# Patient Record
Sex: Female | Born: 2000 | Race: White | Hispanic: No | Marital: Single | Smoking: Never smoker
Health system: Southern US, Community
[De-identification: ages and names within clinical notes are randomized; demographics above are authoritative.]

## PROBLEM LIST (undated history)

## (undated) DIAGNOSIS — J302 Other seasonal allergic rhinitis: Secondary | ICD-10-CM

## (undated) DIAGNOSIS — E669 Obesity, unspecified: Secondary | ICD-10-CM

## (undated) HISTORY — PX: TONSILLECTOMY: SUR1361

## (undated) HISTORY — PX: TYMPANOSTOMY TUBE PLACEMENT: SHX32

## (undated) HISTORY — PX: ADENOIDECTOMY: SUR15

---
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
## 2021-06-14 ENCOUNTER — Ambulatory Visit (INDEPENDENT_AMBULATORY_CARE_PROVIDER_SITE_OTHER): Payer: Commercial Managed Care - PPO

## 2021-06-14 ENCOUNTER — Ambulatory Visit (INDEPENDENT_AMBULATORY_CARE_PROVIDER_SITE_OTHER): Payer: Commercial Managed Care - PPO | Admitting: Sports Medicine

## 2021-06-14 ENCOUNTER — Other Ambulatory Visit: Payer: Self-pay

## 2021-06-14 DIAGNOSIS — S99912A Unspecified injury of left ankle, initial encounter: Secondary | ICD-10-CM | POA: Insufficient documentation

## 2021-06-14 DIAGNOSIS — M25572 Pain in left ankle and joints of left foot: Secondary | ICD-10-CM

## 2021-06-14 MED ORDER — MELOXICAM 15 MG PO TABS: ORAL_TABLET | ORAL | 3 refills | Status: AC

## 2021-06-14 NOTE — Assessment & Plan Note (Signed)
This is a very pleasant 20 year old female, 3 weeks ago she fell down the stairs, inverted her left ankle, she got an abrasion.  Abrasion is healing well, she just has a single scab that she will leave alone, she does have tenderness over the talar dome, ATFL. Ankle is however stable, adding x-rays, she will get a lace up ankle brace over-the-counter, adding exercises, meloxicam. Return to see me in approximately 4 weeks, MRI ankle if not better.

## 2021-06-14 NOTE — Progress Notes (Signed)
    Procedures performed today:    None.  Independent interpretation of notes and tests performed by another provider:   None.  Brief History, Exam, Impression, and Recommendations:    Left ankle injury This is a very pleasant 20 year old female, 3 weeks ago she fell down the stairs, inverted her left ankle, she got an abrasion.  Abrasion is healing well, she just has a single scab that she will leave alone, she does have tenderness over the talar dome, ATFL. Ankle is however stable, adding x-rays, she will get a lace up ankle brace over-the-counter, adding exercises, meloxicam. Return to see me in approximately 4 weeks, MRI ankle if not better.    ___________________________________________ Ihor Austin. Benjamin Stain, M.D., ABFM., CAQSM. Primary Care and Sports Medicine [facility]  Adjunct Instructor of Family Medicine  [employer]

## 2021-07-12 ENCOUNTER — Telehealth: Payer: Self-pay | Admitting: Pediatrics

## 2021-07-12 ENCOUNTER — Ambulatory Visit: Payer: Commercial Managed Care - PPO | Admitting: Sports Medicine

## 2021-07-12 NOTE — Telephone Encounter (Signed)
Amy Webster called. Amy Webster wants to  cancel her appointment today because she  has been around someone who has tested positive for the flu.

## 2021-07-12 NOTE — Telephone Encounter (Signed)
That is okay, can switch to video or telephone as well if she wanted to talk.

## 2021-07-13 NOTE — Telephone Encounter (Signed)
I did not see this note in time.  I will remove her from the schedule so that she does not get a no show.

## 2022-10-17 ENCOUNTER — Ambulatory Visit
Admission: RE | Admit: 2022-10-17 | Discharge: 2022-10-17 | Disposition: A | Discharge status: Home / Self Care | Payer: Commercial Managed Care - PPO | Source: Ambulatory Visit

## 2022-10-17 ENCOUNTER — Ambulatory Visit: Payer: Self-pay

## 2022-10-17 VITALS — BP 129/80 | HR 79 | Temp 98.0°F | Resp 16 | Ht 63.0 in | Wt 285.0 lb

## 2022-10-17 DIAGNOSIS — M79672 Pain in left foot: Secondary | ICD-10-CM | POA: Diagnosis not present

## 2022-10-17 NOTE — ED Triage Notes (Signed)
Patient report left foot/ankle pain x 3 days. No significant instance for injury other than being on her feet working the past 4 days. H/o stress fracture to same area 5 years ago. Taking IBF

## 2022-10-17 NOTE — Discharge Instructions (Signed)
Rest - try to avoid prolonged standing and high impact activity Ice - apply for 20 minutes 3-4 times daily Elevation - prop up on a pillow Ibuprofen - 800 mg 3x daily (every 6 hours)  Follow with foot specialist on Friday

## 2022-10-17 NOTE — ED Provider Notes (Signed)
[facility]    CSN: [id number] Arrival date & time: 10/17/22  1017      History   Chief Complaint Chief Complaint  Patient presents with   Foot Pain    left    HPI Amy Webster is a 22 y.o. female.  Here with 3 day history of left foot pain She has been on her feet for 14 hour shifts this weekend Denies new injury or trauma. History of stress fracture in pinky toe She has tried ibuprofen a few times that helps Does not currently have pain. 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      Home Medications    Prior to Admission medications   Medication Sig Start Date End Date Taking? Authorizing Provider  JUNEL FE 1/20 1-20 MG-MCG tablet Take 1 tablet by mouth daily. 05/25/21   [provider]  meloxicam (MOBIC) 15 MG tablet One tab PO qAM with a meal for 2 weeks, then daily prn pain. 06/14/21   Silverio Decamp, MD  phentermine (ADIPEX-P) 37.5 MG tablet TAKE 1/2 TO 1 TABLET (18.75-37.5 MG DOSE) BY MOUTH ONCE DAILY WITH BREAKFAST. 09/26/19   [provider]    Family History Family History  Problem Relation Age of Onset   Hypertension Father    Hypertension Mother     Social History Social History   Tobacco Use   Smoking status: Never   Smokeless tobacco: Never  Vaping Use   Vaping Use: Never used  Substance Use Topics   Alcohol use: No   Drug use: No      Allergies   Shellfish allergy   Review of Systems Review of Systems As per HPI  Physical Exam Triage Vital Signs ED Triage Vitals  Enc Vitals Group     BP 10/17/22 1026 129/80     Pulse Rate 10/17/22 1026 79     Resp 10/17/22 1026 16     Temp 10/17/22 1026 98 F (36.7 C)     Temp Source 10/17/22 1026 Oral     SpO2 10/17/22 1026 98 %     Weight 10/17/22 1023 285 lb (129.3 kg)     Height 10/17/22 1023 '5\' 3"'$  (1.6 m)     Head Circumference --      Peak Flow --      Pain Score 10/17/22 1023 2     Pain Loc --      Pain Edu? --      Excl. in [place]? --    No data found.  Updated Vital Signs BP 129/80 (BP Location: Right Arm)   Pulse 79   Temp 98 F (36.7 C) (Oral)   Resp 16   Ht '5\' 3"'$  (1.6 m)   Wt 285 lb (129.3 kg)   LMP 09/26/2022   SpO2 98%   BMI 50.49 kg/m  Physical Exam Vitals and nursing note reviewed.  Constitutional:      General: She is not in acute distress.    Appearance: Normal appearance.  Cardiovascular:     Pulses: Normal pulses.  Musculoskeletal:        General: No swelling, tenderness or deformity. Normal range of motion.     Comments: Full ROM at ankle. Non tender over malleolus, midfoot, toes. Strong pulse. Cap refill of toes < 2 seconds  Skin:    General: Skin is warm and dry.     Capillary Refill: Capillary refill takes less than 2 seconds.     Findings: No bruising.  Neurological:     Mental Status: She is alert and oriented to person, place, and time.     UC Treatments / Results  Labs (all labs ordered are listed, but only abnormal results are displayed) Labs Reviewed - No data to display  EKG  Radiology No results found.  Procedures Procedures (including critical care time)  Medications Ordered in UC Medications - No data to display  Initial Impression / Assessment and Plan / UC Course  I have reviewed the triage vital signs and the nursing notes.  Pertinent labs & imaging results that were available during my  care of the patient were reviewed by me and considered in my medical decision making (see chart for details).  No indication for x-ray imaging today.  Recommend RICE therapy with ibuprofen.  She does have a follow-up appointment with her orthopedic specialist in 3 days.  Final Clinical Impressions(s) / UC Diagnoses   Final diagnoses:  Foot pain, left     Discharge Instructions      Rest - try to avoid prolonged standing and high impact activity Ice - apply for 20 minutes 3-4 times daily Elevation - prop up on a pillow Ibuprofen - 800 mg 3x daily (every 6 hours)  Follow with foot specialist on Friday    ED Prescriptions   None    PDMP not reviewed this encounter.   [place], [place] 10/17/22 1145

## 2022-10-20 ENCOUNTER — Ambulatory Visit (INDEPENDENT_AMBULATORY_CARE_PROVIDER_SITE_OTHER): Payer: Commercial Managed Care - PPO

## 2022-10-20 ENCOUNTER — Ambulatory Visit (INDEPENDENT_AMBULATORY_CARE_PROVIDER_SITE_OTHER): Payer: Commercial Managed Care - PPO | Admitting: Sports Medicine

## 2022-10-20 DIAGNOSIS — M84375A Stress fracture, left foot, initial encounter for fracture: Secondary | ICD-10-CM

## 2022-10-20 DIAGNOSIS — M79672 Pain in left foot: Secondary | ICD-10-CM | POA: Diagnosis not present

## 2022-10-20 NOTE — Assessment & Plan Note (Signed)
Obesity with multiple comorbidities including new onset stress injuries. She is currently taking phentermine without much efficacy, explained her that phentermine was fairly good for short-term weight loss, but I would like her to discuss Zepbound and Wegovy with her PCP. There is also a generic compounded form of semaglutide available at a compounding pharmacy in [place] if unable to get the branded drugs approved.

## 2022-10-20 NOTE — Progress Notes (Signed)
    Procedures performed today:    None.  Independent interpretation of notes and tests performed by another provider:   None.  Brief History, Exam, Impression, and Recommendations:    Stress fracture of metatarsal bone of left foot Amy Webster 22 year old female, she does have a history of morbid obesity, she also has a history of an MRI confirmed fifth metatarsal stress injury with marrow edema, she did well back in 2019 with a postop shoe immobilization. For the past week or so she has been spending a lot of time on her feet at work, she is now having increasing pain second through fifth metatarsal shafts with radiation up to the cuboid. I am concerned about a recurrent stress injury, we will add a cam boot that would like her to wear for about 6 weeks, I would like x-rays today, she has meloxicam at home. We also discussed aggressive weight loss, this is certainly a crucial component in preventing further stress injuries. I would like to see her back in about 6 weeks.  Morbid obesity ([place]) Obesity with multiple comorbidities including new onset stress injuries. She is currently taking phentermine without much efficacy, explained her that phentermine was fairly good for short-term weight loss, but I would like her to discuss Zepbound and Wegovy with her PCP. There is also a generic compounded form of semaglutide available at a compounding pharmacy in [place] if unable to get the branded drugs approved.    ____________________________________________ Gwen Her. Dianah Field, M.D., ABFM., CAQSM., AME. Primary Care and Sports Medicine [facility]  Adjunct Professor of [place] [employer] [facility] [employer]  Risk manager

## 2022-10-20 NOTE — Assessment & Plan Note (Signed)
Pleasant 22 year old female, she does have a history of morbid obesity, she also has a history of an MRI confirmed fifth metatarsal stress injury with marrow edema, she did well back in 2019 with a postop shoe immobilization. For the past week or so she has been spending a lot of time on her feet at work, she is now having increasing pain second through fifth metatarsal shafts with radiation up to the cuboid. I am concerned about a recurrent stress injury, we will add a cam boot that would like her to wear for about 6 weeks, I would like x-rays today, she has meloxicam at home. We also discussed aggressive weight loss, this is certainly a crucial component in preventing further stress injuries. I would like to see her back in about 6 weeks.

## 2022-10-20 NOTE — Patient Instructions (Signed)
Look into [place] with your PCP

## 2022-10-23 ENCOUNTER — Telehealth: Payer: Self-pay | Admitting: Sports Medicine

## 2022-10-23 NOTE — Telephone Encounter (Signed)
She will need to come back as a nurse visit for staff to exchange for another one.

## 2022-10-23 NOTE — Telephone Encounter (Signed)
Patient called she says the Velcro is coming off the top of her boot she is asking for another one

## 2022-10-24 ENCOUNTER — Ambulatory Visit: Payer: Commercial Managed Care - PPO | Admitting: Sports Medicine

## 2022-12-01 ENCOUNTER — Ambulatory Visit (INDEPENDENT_AMBULATORY_CARE_PROVIDER_SITE_OTHER): Payer: Commercial Managed Care - PPO | Admitting: Sports Medicine

## 2022-12-01 DIAGNOSIS — M84375G Stress fracture, left foot, subsequent encounter for fracture with delayed healing: Secondary | ICD-10-CM | POA: Diagnosis not present

## 2022-12-01 MED ORDER — SEMAGLUTIDE (2 MG/DOSE) 8 MG/3ML ~~LOC~~ SOPN: PEN_INJECTOR | SUBCUTANEOUS | 3 refills | Status: DC

## 2022-12-01 NOTE — Assessment & Plan Note (Signed)
This is a very pleasant 22 year old female, she has a long history of left foot pain, she does have a history of MRI confirmed fifth metatarsal stress injury with bone marrow edema back in 2019 with postop shoe immobilization she recovered. Unfortunately now for about 6 to 7 weeks she has had increasing pain second through fifth metatarsal shafts. We placed her in a boot and recommended minimize weightbearing. 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 She is a necessary employee, and really wants to put in the work to help out the company. Her work has been helpful, and they have minimized the amount of time she has to stand but unfortunately she has not improved dramatically. At this point due to failure of conservative treatment and greater than 6 weeks of symptoms with unrevealing x-rays we will proceed with an MRI of the left foot. She will continue the boot and go into a rolling knee scooter. We will do this for 1 to 2 weeks and if she still does not notice improvement she will go into a cast with a postop shoe.

## 2022-12-01 NOTE — Progress Notes (Signed)
    Procedures performed today:    None.  Independent interpretation of notes and tests performed by another provider:   None.  Brief History, Exam, Impression, and Recommendations:    Stress fracture of metatarsal bone of left foot This is a very pleasant 22 year old female, she has a long history of left foot pain, she does have a history of MRI confirmed fifth metatarsal stress injury with bone marrow edema back in 2019 with postop shoe immobilization she recovered. Unfortunately now for about 6 to 7 weeks she has had increasing pain second through fifth metatarsal shafts. We placed her in a boot and recommended minimize weightbearing. 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 She is a necessary employee, and really wants to put in the work to help out the company. Her work has been helpful, and they have minimized the amount of time she has to stand but unfortunately she has not improved dramatically. At this point due to failure of conservative treatment and greater than 6 weeks of symptoms with unrevealing x-rays we will proceed with an MRI of the left foot. She will continue the boot and go into a rolling knee scooter. We will do this for 1 to 2 weeks and if she still does not notice improvement she will go into a cast with a postop shoe.  Morbid obesity (HCC) Working on getting Zepbound approved with her PCP. It sounds like they are still struggling, this is unfortunately typical for getting GLP-1s approved for weight loss these days, I am going to give her a prescription for compounded semaglutide, she can get this at a pharmacy in [place] for about $200 a month, they will hold onto this for now, and if Zepbound is approved I would certainly recommend she use the branded Zepbound rather than the compounded semaglutide.    ____________________________________________ Ihor Austin. Benjamin Stain, M.D., ABFM., CAQSM.,  AME. Primary Care and Sports Medicine [facility]  Adjunct Professor of Family Medicine  [employer]  Restaurant manager, fast food

## 2022-12-01 NOTE — Assessment & Plan Note (Signed)
Working on getting Zepbound approved with her PCP. It sounds like they are still struggling, this is unfortunately typical for getting GLP-1s approved for weight loss these days, I am going to give her a prescription for compounded semaglutide, she can get this at a pharmacy in [place] for about $200 a month, they will hold onto this for now, and if Zepbound is approved I would certainly recommend she use the branded Zepbound rather than the compounded semaglutide.

## 2022-12-11 ENCOUNTER — Ambulatory Visit (INDEPENDENT_AMBULATORY_CARE_PROVIDER_SITE_OTHER): Payer: Commercial Managed Care - PPO

## 2022-12-11 DIAGNOSIS — M84375G Stress fracture, left foot, subsequent encounter for fracture with delayed healing: Secondary | ICD-10-CM

## 2022-12-12 ENCOUNTER — Ambulatory Visit
Admission: RE | Admit: 2022-12-12 | Discharge: 2022-12-12 | Disposition: A | Discharge status: Home / Self Care | Payer: Commercial Managed Care - PPO | Source: Ambulatory Visit | Attending: Family Medicine | Admitting: Family Medicine

## 2022-12-12 VITALS — BP 142/94 | HR 102 | Temp 98.1°F | Resp 16

## 2022-12-12 DIAGNOSIS — J029 Acute pharyngitis, unspecified: Secondary | ICD-10-CM

## 2022-12-12 DIAGNOSIS — K122 Cellulitis and abscess of mouth: Secondary | ICD-10-CM

## 2022-12-12 LAB — POCT RAPID STREP A (OFFICE): Rapid Strep A Screen: NEGATIVE

## 2022-12-12 MED ORDER — PREDNISONE 20 MG PO TABS: ORAL_TABLET | ORAL | 0 refills | Status: AC

## 2022-12-12 MED ORDER — AMOXICILLIN-POT CLAVULANATE 875-125 MG PO TABS: 1.0000 | ORAL_TABLET | Freq: Two times a day (BID) | ORAL | 0 refills | Status: AC

## 2022-12-12 NOTE — ED Provider Notes (Signed)
[facility]    CSN: [id number] Arrival date & time: 12/12/22  9147      History   Chief Complaint Chief Complaint  Patient presents with   Sore Throat    drainage  covid test was negative - Entered by patient   Otalgia    HPI Amy Webster is a 22 y.o. female.   HPI 21 year old female presents with sore throat, earache and congestion for 3 days.  PMH significant for morbid obesity, seasonal allergy, and bilateral patellofemoral syndrome.  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      Home Medications    Prior to Admission medications   Medication Sig Start Date End Date Taking? Authorizing Provider  amoxicillin-clavulanate (AUGMENTIN) 875-125 MG tablet Take 1 tablet by mouth 2 (two) times daily for 10 days. 12/12/22 12/22/22 Yes Trevor Iha, FNP  predniSONE (DELTASONE) 20 MG tablet Take 3 tabs PO daily x 5 days. 12/12/22  Yes Trevor Iha, FNP  JUNEL FE 1/20 1-20 MG-MCG tablet Take 1 tablet by mouth daily. 05/25/21   [provider]  meloxicam (MOBIC) 15 MG tablet One tab PO qAM with a meal for 2 weeks, then daily prn pain. 06/14/21   Monica Becton, MD  phentermine (ADIPEX-P) 37.5 MG tablet TAKE 1/2 TO 1 TABLET (18.75-37.5 MG DOSE) BY MOUTH ONCE DAILY WITH BREAKFAST. 09/26/19   [provider]  Semaglutide, 2 MG/DOSE, 8 MG/3ML SOPN Semaglutide 2.5 mg/mL + Vit B6 10mg /mL.  Inject 10u/0.49mL/0.25 mg subcu weekly  x4 weeks then 20u/0.58mL/0.5 mg subcu weekly x4 weeks, then 40u/0.74mL/1 mg subcu weekly x4 weeks then 68u/0.72mL/1.7mg  subcu weekly x4 weeks then 100u/39mL/2.5mg  subcu weekly. 12/01/22   Monica Becton, MD    Family History Family History  Problem Relation Age of Onset   Hypertension Father    Hypertension Mother     Social History Social History   Tobacco Use   Smoking status: Never   Smokeless tobacco: Never  Vaping Use   Vaping Use: Never used  Substance Use Topics   Alcohol use: No   Drug use: No     Allergies   Shellfish allergy   Review of Systems Review of Systems  HENT:  Positive for congestion and sore throat.   All other systems reviewed and are negative.    Physical Exam Triage Vital Signs ED Triage Vitals  Enc Vitals Group     BP 12/12/22 0958 (!) 142/94     Pulse Rate 12/12/22 0956 (!) 102     Resp 12/12/22 0956 16     Temp 12/12/22 0956 98.1 F (36.7 C)     Temp src --      SpO2 12/12/22 0956 97 %     Weight --      Height --      Head Circumference --      Peak Flow --  Pain Score 12/12/22 0955 10     Pain Loc --      Pain Edu? --      Excl. in [place]? --    No data found.  Updated Vital Signs BP (!) 142/94   Pulse (!) 102   Temp 98.1 F (36.7 C)   Resp 16   LMP 12/01/2022 (Exact Date)   SpO2 97%      Physical Exam Vitals and nursing note reviewed.  Constitutional:      Appearance: Normal appearance. She is well-developed. She is obese. She is ill-appearing.  HENT:     Head: Normocephalic and atraumatic.     Right Ear: Tympanic membrane, ear canal and external ear normal.     Left Ear: Tympanic membrane, ear canal and external ear normal.     Mouth/Throat:     Mouth: Mucous membranes are moist.     Pharynx: Oropharynx is clear. Uvula midline. Posterior oropharyngeal erythema and uvula swelling present.     Tonsils: 2+ on the right. 2+ on the left.  Eyes:     Extraocular Movements: Extraocular movements intact.      Conjunctiva/sclera: Conjunctivae normal.     Pupils: Pupils are equal, round, and reactive to light.  Cardiovascular:     Rate and Rhythm: Normal rate and regular rhythm.     Pulses: Normal pulses.     Heart sounds: Normal heart sounds.  Pulmonary:     Effort: Pulmonary effort is normal.     Breath sounds: Normal breath sounds. No wheezing, rhonchi or rales.  Musculoskeletal:        General: Normal range of motion.     Cervical back: Normal range of motion and neck supple.  Skin:    General: Skin is warm and dry.  Neurological:     General: No focal deficit present.     Mental Status: She is alert and oriented to person, place, and time.  Psychiatric:        Mood and Affect: Mood normal.        Behavior: Behavior normal.        Thought Content: Thought content normal.      UC Treatments / Results  Labs (all labs ordered are listed, but only abnormal results are displayed) Labs Reviewed  CULTURE, GROUP A STREP [facility])  POCT RAPID STREP A (OFFICE)    EKG   Radiology No results found.  Procedures Procedures (including critical care time)  Medications Ordered in UC Medications - No data to display  Initial Impression / Assessment and Plan / UC Course  I have reviewed the triage vital signs and the nursing notes.  Pertinent labs & imaging results that were available during my care of the patient were reviewed by me and considered in my medical decision making (see chart for details).     MDM: 1.  Uvulitis-Rx'd Augmentin 875/125 mg twice daily x 10 days; 2.  Acute pharyngitis, unspecified etiology-Rx'd prednisone 60 mg daily x 5 days, rapid strep negative, throat culture ordered. Patient to take medications as directed with food to completion.  Advised patient to take Prednisone with first dose of Augmentin for the next 5 of 10 days.  Encouraged increase daily water intake to 64 ounces per day while taking these medications.  Advised if symptoms worsen and/or unresolved  please follow-up with PCP or here for further evaluation.  Patient discharged home, hemodynamically stable.  Work note provided to patient prior to discharge. Final Clinical Impressions(s) / UC  Diagnoses   Final diagnoses:  Uvulitis  Acute pharyngitis, unspecified etiology     Discharge Instructions      Patient to take medications as directed with food to completion.  Advised patient to take Prednisone with first dose of Augmentin for the next 5 of 10 days.  Encouraged increase daily water intake to 64 ounces per day while taking these medications.  Advised if symptoms worsen and/or unresolved please follow-up with PCP or here for further evaluation.     ED Prescriptions     Medication Sig Dispense Auth. Provider   amoxicillin-clavulanate (AUGMENTIN) 875-125 MG tablet Take 1 tablet by mouth 2 (two) times daily for 10 days. 20 tablet Trevor Iha, FNP   predniSONE (DELTASONE) 20 MG tablet Take 3 tabs PO daily x 5 days. 15 tablet Trevor Iha, FNP      PDMP not reviewed this encounter.   Trevor Iha, FNP 12/12/22 1135

## 2022-12-12 NOTE — ED Triage Notes (Signed)
Pt presents to uc with co of sore throat, otalgia and congestion since Saturday night. Pt has been using otc cold and flu medications for symptom management and zyrtec.

## 2022-12-12 NOTE — Discharge Instructions (Addendum)
Patient to take medications as directed with food to completion.  Advised patient to take Prednisone with first dose of Augmentin for the next 5 of 10 days.  Encouraged increase daily water intake to 64 ounces per day while taking these medications.  Advised if symptoms worsen and/or unresolved please follow-up with PCP or here for further evaluation.

## 2022-12-13 ENCOUNTER — Ambulatory Visit: Payer: Commercial Managed Care - PPO | Admitting: Sports Medicine

## 2022-12-13 LAB — CULTURE, GROUP A STREP (THRC)

## 2022-12-14 LAB — CULTURE, GROUP A STREP (THRC)

## 2022-12-15 LAB — CULTURE, GROUP A STREP (THRC)

## 2022-12-16 LAB — CULTURE, GROUP A STREP (THRC)

## 2022-12-20 ENCOUNTER — Ambulatory Visit (INDEPENDENT_AMBULATORY_CARE_PROVIDER_SITE_OTHER): Payer: Commercial Managed Care - PPO | Admitting: Sports Medicine

## 2022-12-20 DIAGNOSIS — M84375D Stress fracture, left foot, subsequent encounter for fracture with routine healing: Secondary | ICD-10-CM | POA: Diagnosis not present

## 2022-12-20 MED ORDER — SEMAGLUTIDE (2 MG/DOSE) 8 MG/3ML ~~LOC~~ SOPN
PEN_INJECTOR | SUBCUTANEOUS | 3 refills | Status: AC
Start: 2022-12-20 — End: ?

## 2022-12-20 MED ORDER — ONDANSETRON 8 MG PO TBDP
8.0000 mg | ORAL_TABLET | Freq: Three times a day (TID) | ORAL | 3 refills | Status: AC | PRN
Start: 2022-12-20 — End: ?

## 2022-12-20 NOTE — Assessment & Plan Note (Signed)
Unable to get Zepbound approved, she did discussed powdered semaglutide with her PCP who agrees with treatment, [facility], and she can follow this up with additional refills with her PCP. Adding Zofran as well for as needed nausea.

## 2022-12-20 NOTE — Progress Notes (Signed)
    Procedures performed today:    None.  Independent interpretation of notes and tests performed by another provider:   None.  Brief History, Exam, Impression, and Recommendations:    Stress fracture of metatarsal bone of left foot This is a very pleasant 22 year old female, she has a long history of left foot pain, historically she had an MRI with metatarsal stress fracture, bone marrow edema back in 2019, she recovered with postop shoe immobilization. At the last visit approximately 3 weeks ago she had had about 2 months of increasing pain second through fifth metatarsals, she was working at [employer] a lot of time on her feet. We placed her in a boot and recommended minimize weightbearing. We also obtained an MRI, MRI showed some nonspecific dorsal subcutaneous edema but no obvious stress fractures. She has improved considerably in the boot, but still has some discomfort when removing it. As she is continuing to improve she will do an additional 3 weeks in the boot before discontinuing. Return to see me on an as-needed basis for this.  Morbid obesity (HCC) Unable to get Zepbound approved, she did discussed powdered semaglutide with her PCP who agrees with treatment, I will [facility], and she can follow this up with additional refills with her PCP. Adding Zofran as well for as needed nausea.    ____________________________________________ Ihor Austin. Benjamin Stain, M.D., ABFM., CAQSM., AME. Primary Care and Sports Medicine [place] [facility]  Adjunct Professor of Family Medicine  [employer] [employer]  Restaurant manager, fast food

## 2022-12-20 NOTE — Assessment & Plan Note (Signed)
This is a very pleasant 22 year old female, she has a long history of left foot pain, historically she had an MRI with metatarsal stress fracture, bone marrow edema back in 2019, she recovered with postop shoe immobilization. At the last visit approximately 3 weeks ago she had had about 2 months of increasing pain second through fifth metatarsals, she was working at [employer] a lot of time on her feet. We placed her in a boot and recommended minimize weightbearing. We also obtained an MRI, MRI showed some nonspecific dorsal subcutaneous edema but no obvious stress fractures. She has improved considerably in the boot, but still has some discomfort when removing it. As she is continuing to improve she will do an additional 3 weeks in the boot before discontinuing. Return to see me on an as-needed basis for this.

## 2023-09-21 ENCOUNTER — Ambulatory Visit (INDEPENDENT_AMBULATORY_CARE_PROVIDER_SITE_OTHER): Payer: Commercial Managed Care - PPO | Admitting: Sports Medicine

## 2023-09-21 ENCOUNTER — Ambulatory Visit (INDEPENDENT_AMBULATORY_CARE_PROVIDER_SITE_OTHER): Payer: Commercial Managed Care - PPO

## 2023-09-21 DIAGNOSIS — M79641 Pain in right hand: Secondary | ICD-10-CM | POA: Insufficient documentation

## 2023-09-21 NOTE — Progress Notes (Signed)
    Procedures performed today:    None.  Independent interpretation of notes and tests performed by another provider:   None.  Brief History, Exam, Impression, and Recommendations:    Right hand pain Pleasant 23 year old female, approximately 4 weeks ago she took an impact to the dorsum of her right hand, pain directly over the fourth metacarpal shaft, since then she has had pain and swelling with some numbness in the fourth webspace. On exam she has tenderness directly over the fourth metacarpal shaft, no pain elsewhere, skin looks okay. Good motion and good strength. She does some dysesthesia in the fourth webspace compared to the other webspaces. I think she likely had a contusion or fracture, this compresses the common digital nerve and is giving her paresthesias in the proper digital nerve distribution. We will get x-rays, she will do some home PT and I reassured her that sensation would improve. We will touch base in 6 weeks to make sure things are good.    ____________________________________________ Debby PARAS. Curtis, M.D., ABFM., CAQSM., AME. Primary Care and Sports Medicine [facility]  Adjunct Professor of [facility] Medicine  [employer]  Restaurant Manager, [employer]

## 2023-09-21 NOTE — Assessment & Plan Note (Addendum)
 Pleasant 23 year old female, approximately 4 weeks ago she took an impact to the dorsum of her right hand, pain directly over the fourth metacarpal shaft, since then she has had pain and swelling with some numbness in the fourth webspace. On exam she has tenderness directly over the fourth metacarpal shaft, no pain elsewhere, skin looks okay. Good motion and good strength. She does some dysesthesia in the fourth webspace compared to the other webspaces. I think she likely had a contusion or fracture, this compresses the common digital nerve and is giving her paresthesias in the proper digital nerve distribution. We will get x-rays, she will do some home PT and I reassured her that sensation would improve. We will touch base in 6 weeks to make sure things are good.

## 2023-11-02 ENCOUNTER — Ambulatory Visit: Payer: Commercial Managed Care - PPO | Admitting: Sports Medicine

## 2024-04-15 ENCOUNTER — Encounter: Payer: Self-pay | Admitting: Sports Medicine
